# Patient Record
Sex: Female | Born: 1960 | Race: White | Hispanic: No | Smoking: Current every day smoker
Health system: Southern US, Community
[De-identification: ages and names within clinical notes are randomized; demographics above are authoritative.]

## PROBLEM LIST (undated history)

## (undated) DIAGNOSIS — J449 Chronic obstructive pulmonary disease, unspecified: Secondary | ICD-10-CM

## (undated) DIAGNOSIS — G629 Polyneuropathy, unspecified: Secondary | ICD-10-CM

## (undated) DIAGNOSIS — I10 Essential (primary) hypertension: Secondary | ICD-10-CM

## (undated) DIAGNOSIS — E119 Type 2 diabetes mellitus without complications: Secondary | ICD-10-CM

## (undated) DIAGNOSIS — E7800 Pure hypercholesterolemia, unspecified: Secondary | ICD-10-CM

## (undated) HISTORY — PX: TONSILLECTOMY: SUR1361

## (undated) HISTORY — PX: TUBAL LIGATION: SHX77

---

## 2008-03-05 ENCOUNTER — Emergency Department (HOSPITAL_COMMUNITY): Admission: EM | Admit: 2008-03-05 | Discharge: 2008-03-06 | Payer: Self-pay | Admitting: Emergency Medicine

## 2008-03-09 ENCOUNTER — Emergency Department (HOSPITAL_COMMUNITY): Admission: EM | Admit: 2008-03-09 | Discharge: 2008-03-09 | Payer: Self-pay | Admitting: Emergency Medicine

## 2008-07-07 ENCOUNTER — Emergency Department (HOSPITAL_COMMUNITY): Admission: EM | Admit: 2008-07-07 | Discharge: 2008-07-07 | Payer: Self-pay | Admitting: Emergency Medicine

## 2008-12-22 ENCOUNTER — Inpatient Hospital Stay (HOSPITAL_COMMUNITY): Admission: EM | Admit: 2008-12-22 | Discharge: 2008-12-25 | Payer: Self-pay | Admitting: Emergency Medicine

## 2009-02-11 ENCOUNTER — Emergency Department (HOSPITAL_COMMUNITY): Admission: EM | Admit: 2009-02-11 | Discharge: 2009-02-11 | Payer: Self-pay | Admitting: Emergency Medicine

## 2009-09-10 ENCOUNTER — Emergency Department (HOSPITAL_COMMUNITY): Admission: EM | Admit: 2009-09-10 | Discharge: 2009-09-10 | Payer: Self-pay | Admitting: Emergency Medicine

## 2009-12-30 ENCOUNTER — Emergency Department (HOSPITAL_COMMUNITY): Admission: EM | Admit: 2009-12-30 | Discharge: 2009-12-31 | Payer: Self-pay | Admitting: Emergency Medicine

## 2010-06-26 LAB — BASIC METABOLIC PANEL
BUN: 5 mg/dL — ABNORMAL LOW (ref 6–23)
CO2: 27 mEq/L (ref 19–32)
Chloride: 105 mEq/L (ref 96–112)
GFR calc Af Amer: >60 mL/min (ref >60)
Glucose, Bld: 99 mg/dL (ref 70–99)
Potassium: 3.8 mEq/L (ref 3.5–5.1)

## 2010-07-16 LAB — DIFFERENTIAL
Basophils Absolute: 0 10*3/uL (ref 0.0–0.1)
Basophils Relative: 0 % (ref 0–1)
Eosinophils Absolute: 0 10*3/uL (ref 0.0–0.7)
Eosinophils Relative: 0 % (ref 0–5)
Lymphs Abs: 3 10*3/uL (ref 0.7–4.0)
Monocytes Absolute: 0.9 10*3/uL (ref 0.1–1.0)
Monocytes Relative: 8 % (ref 3–12)
Neutrophils Relative %: 66 % (ref 43–77)

## 2010-07-16 LAB — URINALYSIS, ROUTINE W REFLEX MICROSCOPIC
Glucose, UA: NEGATIVE mg/dL
Hgb urine dipstick: NEGATIVE
Ketones, ur: 40 mg/dL — AB
Leukocytes, UA: NEGATIVE
Nitrite: NEGATIVE
Protein, ur: 30 mg/dL — AB
Specific Gravity, Urine: 1.025 (ref 1.005–1.030)
Urobilinogen, UA: 1 mg/dL (ref 0.0–1.0)
pH: 6.5 (ref 5.0–8.0)

## 2010-07-16 LAB — BASIC METABOLIC PANEL
BUN: 8 mg/dL (ref 6–23)
CO2: 30 mEq/L (ref 19–32)
Chloride: 104 mEq/L (ref 96–112)
Creatinine, Ser: 0.81 mg/dL (ref 0.4–1.2)
GFR calc non Af Amer: >60 mL/min (ref >60)
Glucose, Bld: 90 mg/dL (ref 70–99)
Potassium: 3.7 mEq/L (ref 3.5–5.1)
Sodium: 138 mEq/L (ref 135–145)

## 2010-07-16 LAB — CBC
HCT: 47.2 % — ABNORMAL HIGH (ref 36.0–46.0)
Hemoglobin: 16.6 g/dL — ABNORMAL HIGH (ref 12.0–15.0)
MCHC: 35.1 g/dL (ref 30.0–36.0)
MCV: 92.5 fL (ref 78.0–100.0)
Platelets: 301 10*3/uL (ref 150–400)
RBC: 5.1 MIL/uL (ref 3.87–5.11)
RDW: 14.1 % (ref 11.5–15.5)
WBC: 11.6 10*3/uL — ABNORMAL HIGH (ref 4.0–10.5)

## 2010-07-16 LAB — URINE MICROSCOPIC-ADD ON

## 2010-07-18 LAB — COMPREHENSIVE METABOLIC PANEL
Albumin: 4 g/dL (ref 3.5–5.2)
Alkaline Phosphatase: 60 U/L (ref 39–117)
BUN: 5 mg/dL — ABNORMAL LOW (ref 6–23)
CO2: 32 mEq/L (ref 19–32)
Calcium: 9.7 mg/dL (ref 8.4–10.5)
Chloride: 89 mEq/L — ABNORMAL LOW (ref 96–112)
GFR calc Af Amer: >60 mL/min (ref >60)
Total Protein: 7.4 g/dL (ref 6.0–8.3)

## 2010-07-18 LAB — MAGNESIUM: Magnesium: 2.1 mg/dL (ref 1.5–2.5)

## 2010-07-18 LAB — DIFFERENTIAL
Basophils Absolute: 0 10*3/uL (ref 0.0–0.1)
Basophils Relative: 0 % (ref 0–1)
Eosinophils Absolute: 0 10*3/uL (ref 0.0–0.7)
Eosinophils Relative: 0 % (ref 0–5)
Lymphocytes Relative: 9 % — ABNORMAL LOW (ref 12–46)
Lymphs Abs: 1.1 10*3/uL (ref 0.7–4.0)
Monocytes Relative: 2 % — ABNORMAL LOW (ref 3–12)
Neutro Abs: 10.9 10*3/uL — ABNORMAL HIGH (ref 1.7–7.7)

## 2010-07-18 LAB — POTASSIUM
Potassium: 2.5 mEq/L — CL (ref 3.5–5.1)
Potassium: 3.1 mEq/L — ABNORMAL LOW (ref 3.5–5.1)

## 2010-07-18 LAB — URINALYSIS, ROUTINE W REFLEX MICROSCOPIC
Ketones, ur: NEGATIVE mg/dL
Leukocytes, UA: NEGATIVE
Protein, ur: 100 mg/dL — AB
Urobilinogen, UA: 0.2 mg/dL (ref 0.0–1.0)
pH: 6.5 (ref 5.0–8.0)

## 2010-07-18 LAB — GLUCOSE, CAPILLARY
Glucose-Capillary: 122 mg/dL — ABNORMAL HIGH (ref 70–99)
Glucose-Capillary: 172 mg/dL — ABNORMAL HIGH (ref 70–99)
Glucose-Capillary: 183 mg/dL — ABNORMAL HIGH (ref 70–99)

## 2010-07-18 LAB — CBC
Hemoglobin: 15.1 g/dL — ABNORMAL HIGH (ref 12.0–15.0)
MCHC: 35 g/dL (ref 30.0–36.0)
RDW: 13.6 % (ref 11.5–15.5)

## 2010-07-18 LAB — URINE MICROSCOPIC-ADD ON

## 2010-07-18 LAB — BASIC METABOLIC PANEL
BUN: 9 mg/dL (ref 6–23)
CO2: 31 mEq/L (ref 19–32)
Calcium: 9.7 mg/dL (ref 8.4–10.5)
Chloride: 96 mEq/L (ref 96–112)
Creatinine, Ser: 0.66 mg/dL (ref 0.4–1.2)
GFR calc Af Amer: >60 mL/min (ref >60)
Glucose, Bld: 167 mg/dL — ABNORMAL HIGH (ref 70–99)

## 2010-11-02 IMAGING — CR DG CHEST 2V
2 series · 2 of 2 positions shown · non-contrast
Comparison: 07/07/2008

CLINICAL DATA: Shortness of breath and COPD

CHEST - 2 VIEW

[view not recorded (1 of 2)]
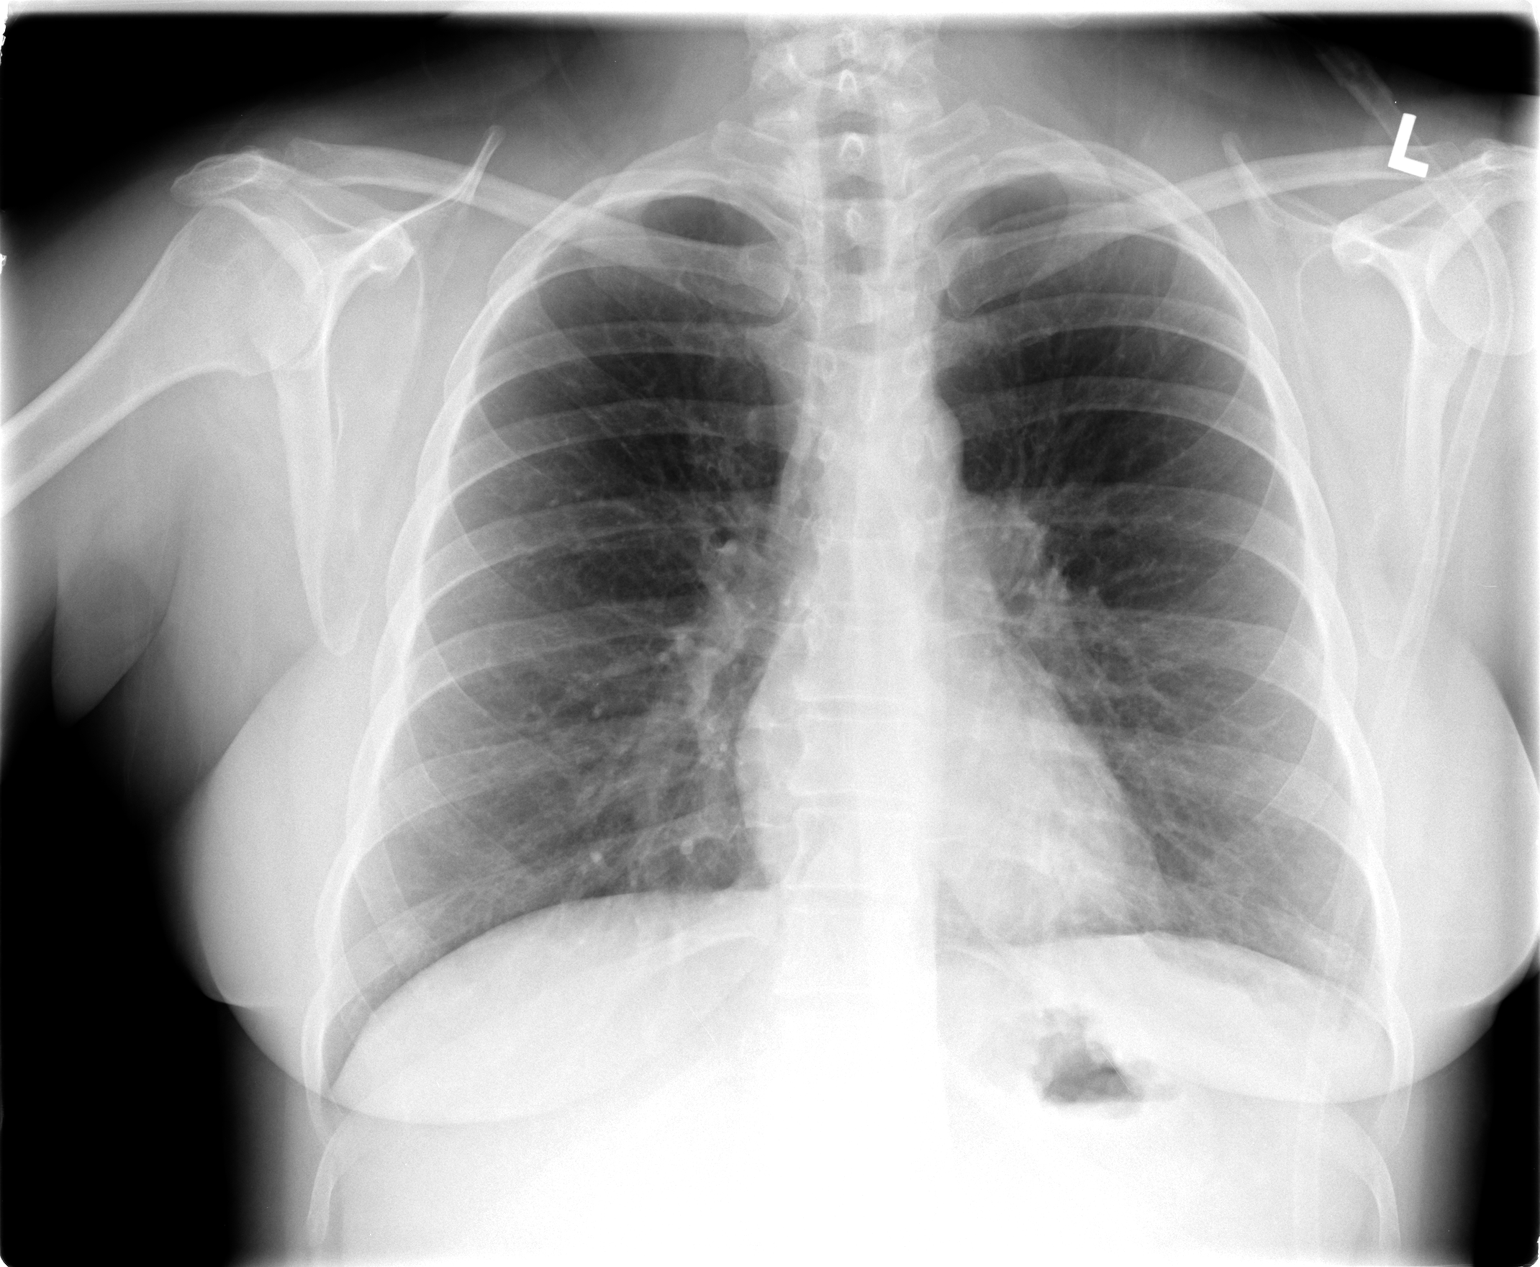

[view not recorded (2 of 2)]
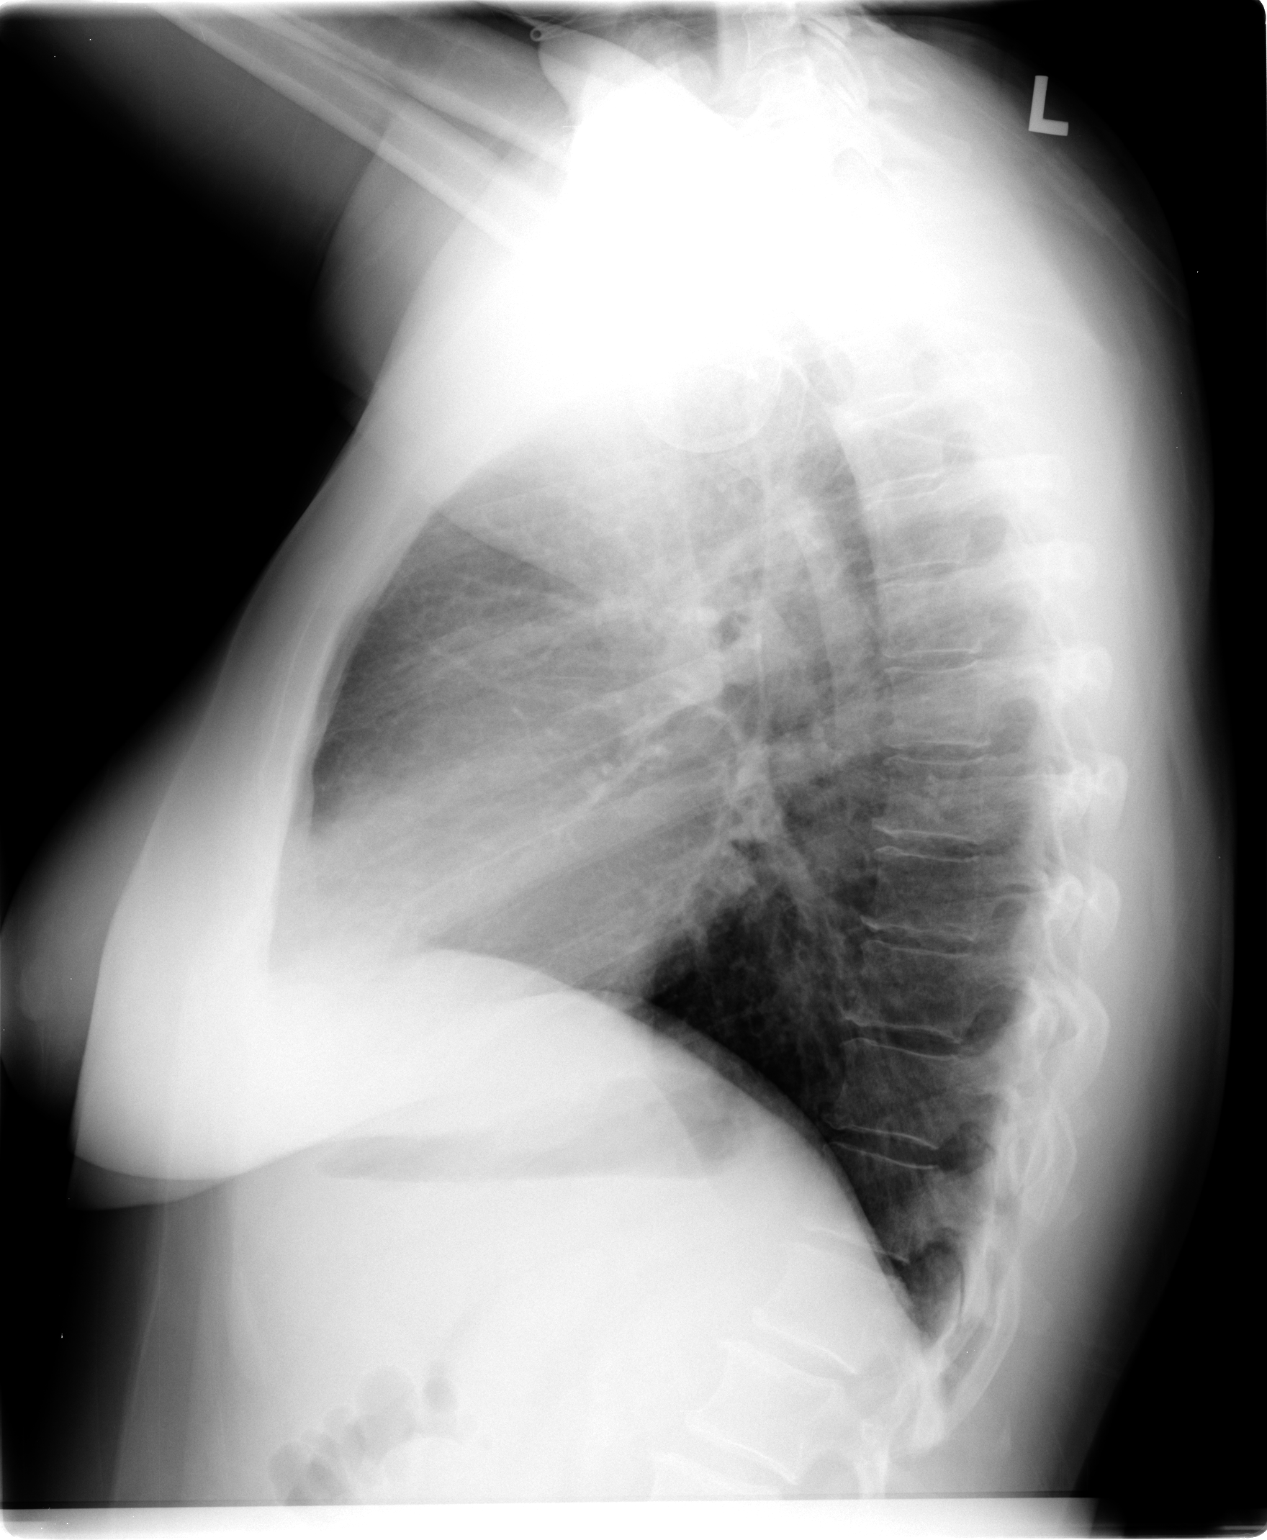

[2 of 2 positions shown; findings below may reference images not displayed]

FINDINGS: Heart heart size is normal.

No pleural effusion or pulmonary edema.

There is some retrocardiac density which may represent atelectasis
and/or infiltrate.

The visualized osseous structures are unremarkable.
IMPRESSION: 1.  Left base atelectasis versus infiltrate.

## 2011-11-10 IMAGING — CR DG CHEST 2V
2 series · 2 of 2 positions shown · non-contrast
Comparison: Chest radiograph performed 02/11/2009

CLINICAL DATA: Left posterior chest pain and shortness of breath;
history of smoking.

CHEST - 2 VIEW

[view not recorded (1 of 2)]
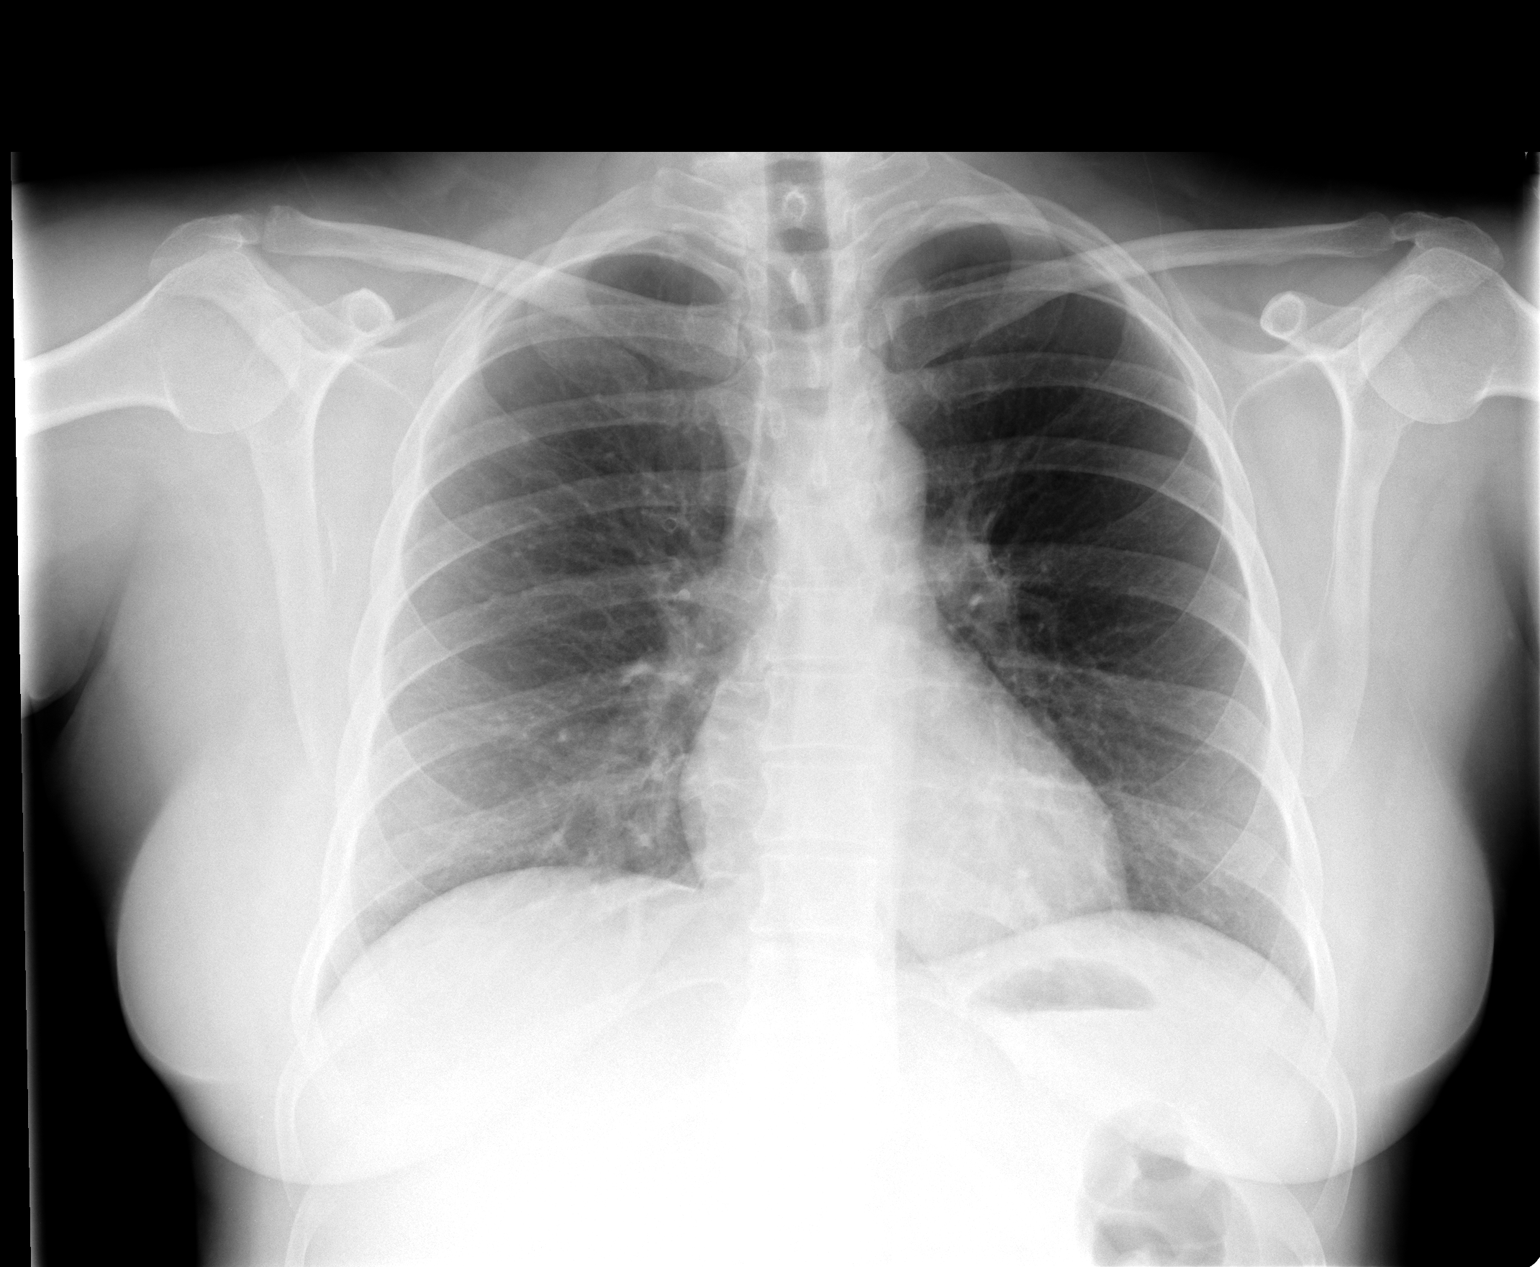

[view not recorded (2 of 2)]
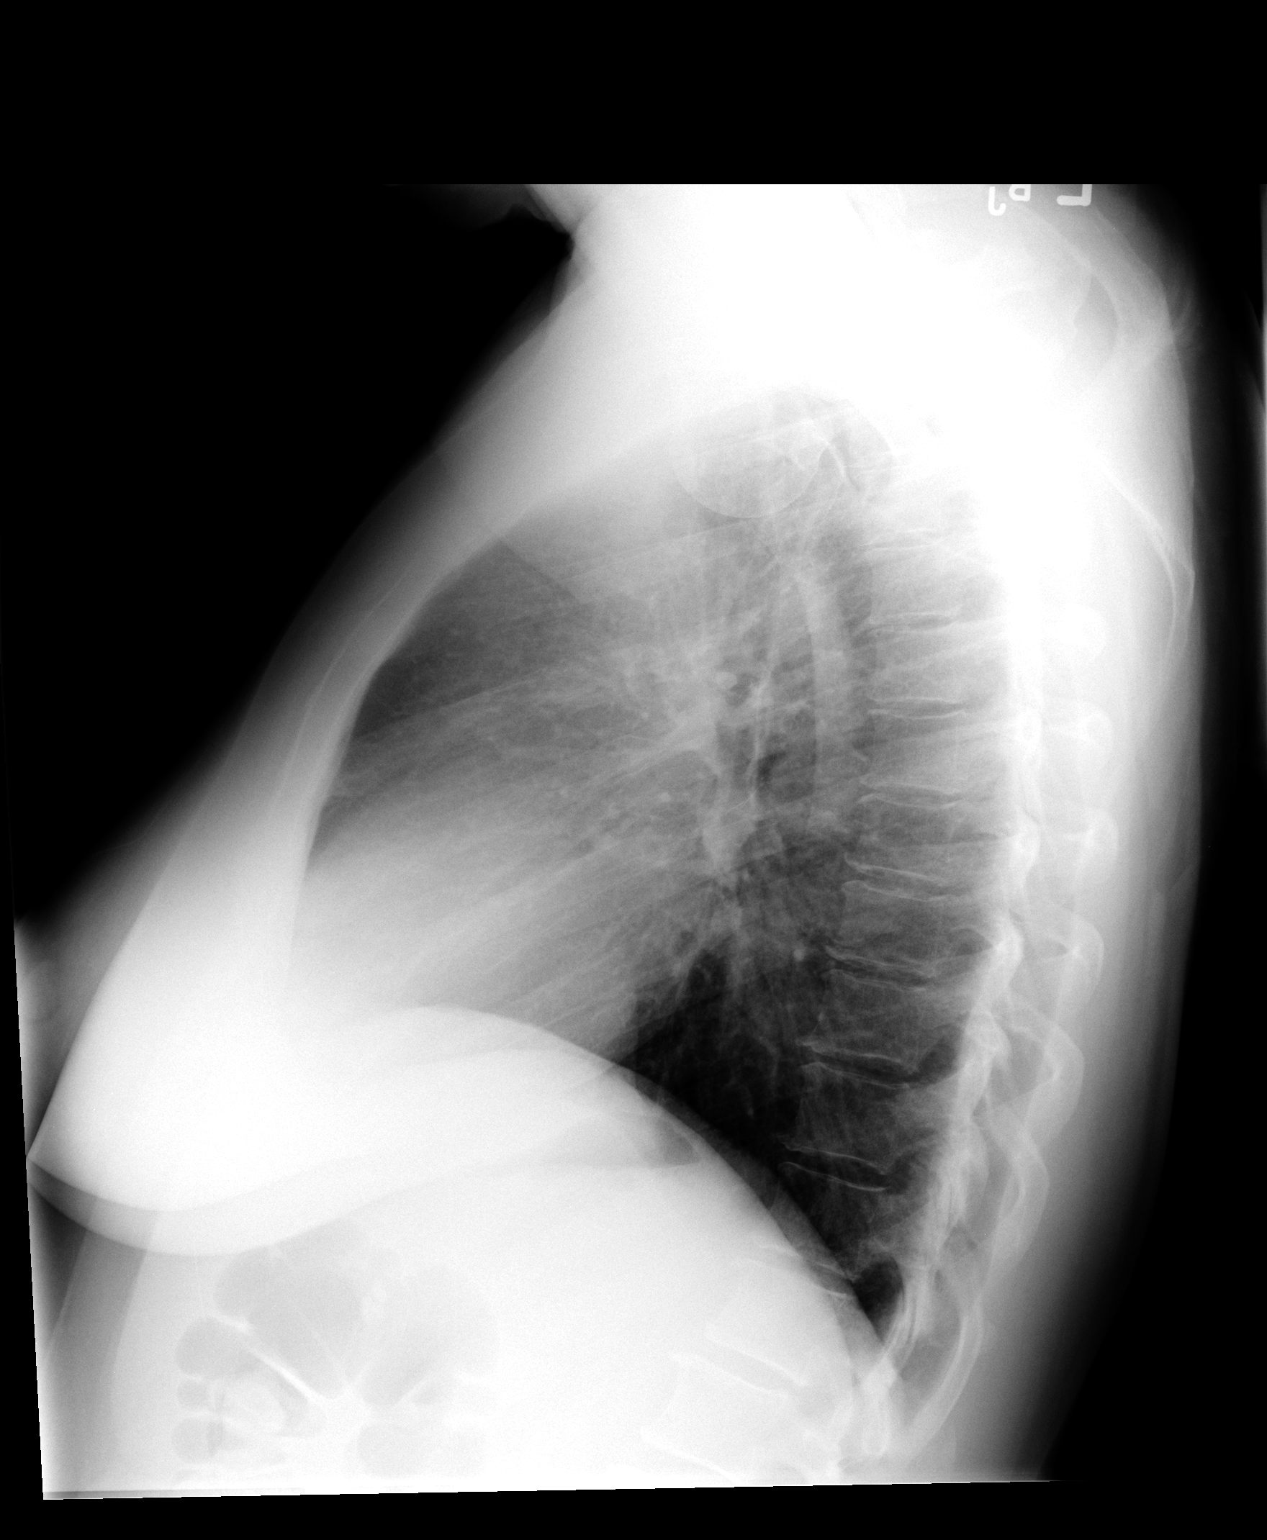

[2 of 2 positions shown; findings below may reference images not displayed]

FINDINGS: The lungs are well-aerated and clear.  There is no
evidence of focal opacification, pleural effusion or pneumothorax.

The heart is normal in size; the mediastinal contour is within
normal limits.  No acute osseous abnormalities are seen.
IMPRESSION: No acute cardiopulmonary process seen.

## 2021-12-23 ENCOUNTER — Emergency Department (HOSPITAL_COMMUNITY)
Admission: EM | Admit: 2021-12-23 | Discharge: 2021-12-23 | Disposition: A | Discharge status: Home / Self Care | Payer: Medicare Other | Attending: Emergency Medicine | Admitting: Emergency Medicine

## 2021-12-23 ENCOUNTER — Encounter (HOSPITAL_COMMUNITY): Payer: Self-pay | Admitting: *Deleted

## 2021-12-23 ENCOUNTER — Emergency Department (HOSPITAL_COMMUNITY): Payer: Medicare Other

## 2021-12-23 ENCOUNTER — Other Ambulatory Visit: Payer: Self-pay

## 2021-12-23 DIAGNOSIS — E119 Type 2 diabetes mellitus without complications: Secondary | ICD-10-CM | POA: Insufficient documentation

## 2021-12-23 DIAGNOSIS — M542 Cervicalgia: Secondary | ICD-10-CM | POA: Diagnosis present

## 2021-12-23 DIAGNOSIS — J449 Chronic obstructive pulmonary disease, unspecified: Secondary | ICD-10-CM | POA: Diagnosis not present

## 2021-12-23 DIAGNOSIS — I10 Essential (primary) hypertension: Secondary | ICD-10-CM | POA: Diagnosis not present

## 2021-12-23 HISTORY — DX: Pure hypercholesterolemia, unspecified: E78.00

## 2021-12-23 HISTORY — DX: Essential (primary) hypertension: I10

## 2021-12-23 HISTORY — DX: Chronic obstructive pulmonary disease, unspecified: J44.9

## 2021-12-23 HISTORY — DX: Type 2 diabetes mellitus without complications: E11.9

## 2021-12-23 HISTORY — DX: Polyneuropathy, unspecified: G62.9

## 2021-12-23 LAB — CBC WITH DIFFERENTIAL/PLATELET
Abs Immature Granulocytes: 0.06 10*3/uL (ref 0.00–0.07)
Basophils Absolute: 0.1 10*3/uL (ref 0.0–0.1)
Basophils Relative: 1 %
Eosinophils Absolute: 0 10*3/uL (ref 0.0–0.5)
Eosinophils Relative: 0 %
HCT: 40.5 % (ref 36.0–46.0)
Hemoglobin: 13.6 g/dL (ref 12.0–15.0)
Immature Granulocytes: 1 %
Lymphocytes Relative: 13 %
Lymphs Abs: 1.7 10*3/uL (ref 0.7–4.0)
MCH: 31.8 pg (ref 26.0–34.0)
MCHC: 33.6 g/dL (ref 30.0–36.0)
MCV: 94.6 fL (ref 80.0–100.0)
Monocytes Absolute: 0.7 10*3/uL (ref 0.1–1.0)
Monocytes Relative: 5 %
Neutro Abs: 10.4 10*3/uL — ABNORMAL HIGH (ref 1.7–7.7)
Neutrophils Relative %: 80 %
Platelets: 345 10*3/uL (ref 150–400)
RBC: 4.28 MIL/uL (ref 3.87–5.11)
RDW: 16.2 % — ABNORMAL HIGH (ref 11.5–15.5)
WBC: 12.9 10*3/uL — ABNORMAL HIGH (ref 4.0–10.5)
nRBC: 0 % (ref 0.0–0.2)

## 2021-12-23 LAB — BASIC METABOLIC PANEL
Anion gap: 10 (ref 5–15)
BUN: 19 mg/dL (ref 6–20)
CO2: 28 mmol/L (ref 22–32)
Calcium: 9.5 mg/dL (ref 8.9–10.3)
Chloride: 99 mmol/L (ref 98–111)
Creatinine, Ser: 0.7 mg/dL (ref 0.44–1.00)
GFR, Estimated: >60 mL/min (ref >60)
Glucose, Bld: 152 mg/dL — ABNORMAL HIGH (ref 70–99)
Potassium: 3.4 mmol/L — ABNORMAL LOW (ref 3.5–5.1)
Sodium: 137 mmol/L (ref 135–145)

## 2021-12-23 MED ORDER — IOHEXOL 300 MG/ML  SOLN
75.0000 mL | Freq: Once | INTRAMUSCULAR | Status: AC | PRN
  Administered 2021-12-23: 75 mL via INTRAVENOUS

## 2021-12-23 NOTE — ED Provider Notes (Signed)
[place] [facility] EMERGENCY DEPARTMENT Provider Note   CSN: [id number] Arrival date & time: 12/23/21  1406     History {Add pertinent medical, surgical, social history, OB history to HPI:1} Chief Complaint  Patient presents with   Neck Pain    Jillian Miller is a 61 y.o. female with Hx of COPD, HTN, DMT2 with neuropathy, hypercholesterolemia and chronic tobacco use presenting to the ED today due to a mass on the left neck/clavicle region.  Has been increasing in size over the last 6 months.  Recently became painful over the last month.  Has noticed some radiation of pain now through to the other side of her neck, wrapping around the back.  Denies chest pain or shortness of breath.  Hx of chronic COPD.  Has periods of acute on chronic bronchitis, however denies recent episodes of this.  Endorses intermittent fevers over the last few months, however denies weight loss and hemoptysis.  Also notes increasing bruising on her arms, thought this may be due to her daily baby aspirin.  Wears sleeves over her arms to cover these.  These are a reddish-purple, do not change color when pressed on, and have not yellowed over time.  The history is provided by the patient and medical records.  Neck Pain      Home Medications Prior to Admission medications   Not on File      Allergies    Patient has no known allergies.    Review of Systems   Review of Systems  Musculoskeletal:  Positive for neck pain.    Physical Exam Updated Vital Signs BP (!) 138/114 (BP Location: Right Arm)   Pulse 86   Temp 98.4 F (36.9 C) (Oral)   Resp 20   Ht 5\' 1"  (1.549 m)   Wt 76.7 kg   SpO2 96%   BMI 31.93 kg/m  Physical Exam Vitals and nursing note reviewed.  Constitutional:      General: She is not in acute distress.    Appearance: She is well-developed. She is obese. She is not ill-appearing, toxic-appearing or diaphoretic.  HENT:     Head: Normocephalic and atraumatic.  Eyes:     Conjunctiva/sclera:  Conjunctivae normal.  Neck:     Comments: Mass of the left supraclavicular region appreciated with surrounding swelling.  Mass is mobile, with mild tenderness, and without warmth erythema or crepitus.   Cardiovascular:     Rate and Rhythm: Normal rate and regular rhythm.     Pulses: Normal pulses.     Heart sounds: No murmur heard. Pulmonary:     Effort: Pulmonary effort is normal. No respiratory distress.     Breath sounds: Wheezing (Minimal, at baseline per pt) and rhonchi present.  Chest:     Chest wall: No tenderness.  Abdominal:     Palpations: Abdomen is soft.     Tenderness: There is no abdominal tenderness.  Musculoskeletal:        General: No swelling.     Cervical back: Neck supple. Tenderness present. No rigidity.     Right lower leg: No edema.     Left lower leg: No edema.     Comments: Without upper or lower extremity swelling, tenderness, erythema, or induration  Skin:    General: Skin is warm and dry.     Capillary Refill: Capillary refill takes less than 2 seconds.  Neurological:     Mental Status: She is alert and oriented to person, place, and time.  Psychiatric:  Mood and Affect: Mood normal.     ED Results / Procedures / Treatments   Labs (all labs ordered are listed, but only abnormal results are displayed) Labs Reviewed - No data to display  EKG None  Radiology DG Chest 2 View  Result Date: 12/23/2021 CLINICAL DATA:  Swelling to the left neck/clavicle area for 6 months. Patient also complains of pain to her lungs. Patient has a history of COPD and productive cough. EXAM: CHEST - 2 VIEW COMPARISON:  Chest radiograph 12/30/2009 FINDINGS: No pleural effusion. No pneumothorax. No discernible soft tissue or osseous abnormality. The heart size and mediastinal contours are within normal limits. Left basilar atelectasis. IMPRESSION: No active cardiopulmonary disease. If further evaluation for soft tissue swelling at the left neck is clinically warranted,  a dedicated targeted soft tissue ultrasound or a CT of the neck could be obtained for more definitive characterization. Electronically Signed   By: Lorenza Cambridge M.D.   On: 12/23/2021 15:03    Procedures Procedures  {Document cardiac monitor, telemetry assessment procedure when appropriate:1}  Medications Ordered in ED Medications - No data to display  ED Course/ Medical Decision Making/ A&P                           Medical Decision Making Amount and/or Complexity of Data Reviewed Labs: ordered. Radiology: ordered.   61 y.o. female presents to the ED for concern of Neck Pain   This involves an extensive number of treatment options, and is a complaint that carries with it a high risk of complications and morbidity.  The emergent differential diagnosis prior to evaluation includes, but is not limited to: nodule, lymphadenitis, cyst, malignancy  This is not an exhaustive differential.   Past Medical History / Co-morbidities / Social History: Hx of COPD, HTN, DMT2 with neuropathy, hypercholesterolemia, and chronic tobacco use Social Determinants of Health include: Elderly; chronic tobacco use for which cessation counseling was provided  Additional History:  Obtained by chart review.  Notably recurrent accounts of COPD hx  Lab Tests: I ordered, and personally interpreted labs.  The pertinent results include:   WBC 12.9 with leukocytosis Platelets 345 No evidence of anemia  Imaging Studies: I ordered imaging studies including CXR and CT neck soft tissue.   I independently visualized and interpreted imaging which showed  CXR: negative for acute cardiopulmonary pathology, though recommended utilizing CT or [place] for further evaluation of area of concern CT neck soft tissue: *** I agree with the radiologist interpretation.  ED Course: Pt well-appearing on exam.  Nontoxic, nonseptic appearing, in NAD.  Afebrile.  Presenting with a increased volume of the left neck/clavicle region.   Increasing in size over the last 6 months.  Recently became painful over the last month.  Now with some pain radiation through to the other side of her neck, wrapping around the upper back.  Denies chest pain or shortness of breath.  Hx of chronic COPD and significant tobacco use.  Has periods of acute on chronic bronchitis, however without recent episodes of this.  Endorses intermittent fevers over the last few months, however denies weight loss and hemoptysis.  Also with recent skin changes, clinically not suggestive of ecchymosis or bruising.  Wears sleeves over her arms to cover these.   Lesions of the lower upper extremities are a reddish-purple, non-blanchable, and have not yellowed over time.  These are not seen anywhere else on the body.  Thyroid non-tender.  ABCs intact.  Mild wheeze/rhonchi on exam, satting at 97-98% on RA.  Without evidence of respiratory distress.  Without chest pain.  Without recent weight loss thought with intermittent subjective fevers.  Difficult to detect a mass of the left supraclavicular region, difficult to differentiate between body habitus and possible mass.  CXR without evidence of active cardiopulmonary disease such as pneumonia, pneumothorax, pleural effusion, or pulmonary edema.  However recommends possible further imaging via CT soft tissue or ultrasound.  Currently without ultrasound at this facility, shared decision making of CT in the ED versus outpatient with PCP follow-up.  Patient elected to continue with CT in the ED today.  I find this reasonable.  Labs pending. Mildly elevated WBC with leukocytosis.  Platelets normal, without evidence of anemia.  CT neck and soft tissue pending. Patient in NAD and in good condition at time of discharge.  Disposition: After consideration the patient's encounter today, I do not feel today's workup suggests an emergent condition requiring admission or immediate intervention beyond what has been performed at this time.  Safe for  discharge; instructed to return immediately for worsening symptoms, change in symptoms or any other concerns.  I have reviewed the patients home medicines and have made adjustments as needed.  Discussed course of treatment with the patient, whom demonstrated understanding.  Patient in agreement and has no further questions.    I discussed this case with my attending physician Dr. Denton Lank, who agreed with the proposed treatment course and cosigned this note including patient's presenting symptoms, physical exam, and planned diagnostics and interventions.  Attending physician stated agreement with plan or made changes to plan which were implemented.     This chart was dictated using voice recognition software.  Despite best efforts to proofread, errors can occur which can change the documentation meaning.   {Document critical care time when appropriate:1} {Document review of labs and clinical decision tools ie heart score, Chads2Vasc2 etc:1}  {Document your independent review of radiology images, and any outside records:1} {Document your discussion with family members, caretakers, and with consultants:1} {Document social determinants of health affecting pt's care:1} {Document your decision making why or why not admission, treatments were needed:1} Final Clinical Impression(s) / ED Diagnoses Final diagnoses:  None    Rx / DC Orders ED Discharge Orders     None

## 2021-12-23 NOTE — ED Triage Notes (Signed)
Pt with swelling to left neck/clavicle area x 6 months.  Pt also c/o pain to her lungs.  Pt with hx of COPD. + productive cough.

## 2021-12-23 NOTE — Discharge Instructions (Signed)
CT imaging was unremarkable today.  Recommend close PCP follow up for re-evaluation and consideration of outpatient US imaging.    Return to the ED for new or worsening symptoms as discussed.

## 2022-07-13 DEATH — deceased
